# Patient Record
Sex: Male | Born: 1940 | Race: White | Hispanic: No | Marital: Married
Health system: Southern US, Community
[De-identification: ages and names within clinical notes are randomized; demographics above are authoritative.]

---

## 2013-04-14 DIAGNOSIS — J329 Chronic sinusitis, unspecified: Secondary | ICD-10-CM | POA: Insufficient documentation

## 2016-08-13 DIAGNOSIS — C20 Malignant neoplasm of rectum: Secondary | ICD-10-CM | POA: Insufficient documentation

## 2016-11-14 DIAGNOSIS — I639 Cerebral infarction, unspecified: Secondary | ICD-10-CM | POA: Insufficient documentation

## 2016-11-14 DIAGNOSIS — I2699 Other pulmonary embolism without acute cor pulmonale: Secondary | ICD-10-CM | POA: Insufficient documentation

## 2016-11-15 DIAGNOSIS — I6322 Cerebral infarction due to unspecified occlusion or stenosis of basilar arteries: Secondary | ICD-10-CM

## 2016-11-15 DIAGNOSIS — T8131XA Disruption of external operation (surgical) wound, not elsewhere classified, initial encounter: Secondary | ICD-10-CM | POA: Insufficient documentation

## 2016-11-15 DIAGNOSIS — I63211 Cerebral infarction due to unspecified occlusion or stenosis of right vertebral arteries: Secondary | ICD-10-CM | POA: Insufficient documentation

## 2016-11-15 DIAGNOSIS — I6381 Other cerebral infarction due to occlusion or stenosis of small artery: Secondary | ICD-10-CM | POA: Insufficient documentation

## 2016-11-19 ENCOUNTER — Other Ambulatory Visit (HOSPITAL_COMMUNITY): Payer: Self-pay | Admitting: Diagnostic Radiology

## 2016-11-19 DIAGNOSIS — I2602 Saddle embolus of pulmonary artery with acute cor pulmonale: Secondary | ICD-10-CM

## 2016-11-19 DIAGNOSIS — I82401 Acute embolism and thrombosis of unspecified deep veins of right lower extremity: Secondary | ICD-10-CM

## 2016-12-23 ENCOUNTER — Ambulatory Visit
Admission: RE | Admit: 2016-12-23 | Discharge: 2016-12-23 | Disposition: A | Discharge status: Home / Self Care | Payer: Medicare Other | Source: Ambulatory Visit | Attending: Diagnostic Radiology | Admitting: Diagnostic Radiology

## 2016-12-23 DIAGNOSIS — I82401 Acute embolism and thrombosis of unspecified deep veins of right lower extremity: Secondary | ICD-10-CM

## 2016-12-23 DIAGNOSIS — I2602 Saddle embolus of pulmonary artery with acute cor pulmonale: Secondary | ICD-10-CM

## 2016-12-23 NOTE — Progress Notes (Signed)
   Referring Physician(s): Henn,Adam  Chief Complaint: The patient is seen in follow up today s/p IVC filter placement 11/13/2016.  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 Unfortunately, he then developed dehiscence of his abdominal wound requiring an emergent repair 11/12/16.  Around this time patient also developed DVT and PE.  He was initially placed on heparin but developed thrombocytopenia and had continued rectal bleeding.  An IVC filter was requested by Dr. Samuel Germany and placed by Dr. Markus Daft 11/16/16. At discharge, patient was started on Xarelto for management of his PE and DVT.  Patient presents to clinic today for follow-up of his IVC filter.  He has been progressing well at rehab with PT.  He denies shortness of breath, fever, chills, rectal bleeding.  He does continue to have some abdominal pain and wears his abdominal binder as recommended.    No past medical history on file.  No past surgical history on file.  Allergies: Patient has no known allergies.  Medications:  Reviewed   No family history on file.  Social History   Social History  . Marital status: Married    Spouse name: N/A  . Number of children: N/A  . Years of education: N/A   Social History Main Topics  . Smoking status: Not on file  . Smokeless tobacco: Not on file  . Alcohol use Not on file  . Drug use: Unknown  . Sexual activity: Not on file   Other Topics Concern  . Not on file   Social History Narrative  . No narrative on file     Vital Signs: There were no vitals taken for this visit.  Physical Exam  Constitutional: He is oriented to person, place, and time. He appears well-developed.  Cardiovascular: Normal rate, regular rhythm and normal heart sounds.   Pulmonary/Chest: Effort normal and breath sounds normal. No respiratory distress.  Abdominal:  Abdominal binder in  place.   Musculoskeletal: He exhibits no edema.  Neurological: He is alert and oriented to person, place, and time.  Skin: Skin is warm and dry.  Nursing note reviewed.   Imaging: No results found.  Labs:  Reviewed, none new since discharge  Assessment: Patient with history of adenocarcinoma of the rectum s/p resection, DVT and PE s/p IVC filter placement 11/16/16 by Dr. Anselm Pancoast, now on anti-coagulation without rectal bleeding.  US performed with no DVT seen today.  See separate dictated report.  Patient doing well since surgery and has been increasing his activity and ambulation with PT at rehab. Per Oncology note 12/07/16, patient to remain on anticoagulation with filter in place for approximately 6 months.  Discussed timeline and implications of removal of IVC filter with patient.  Patient agreeable to having filter removed at 6 months.  Will schedule patient for procedure accordingly.   Signed: Docia Barrier 12/23/2016, 3:25 PM   Please refer to Dr. Anselm Pancoast attestation of this note for management and plan.

## 2017-01-18 DIAGNOSIS — M109 Gout, unspecified: Secondary | ICD-10-CM | POA: Insufficient documentation

## 2017-01-24 ENCOUNTER — Other Ambulatory Visit (HOSPITAL_COMMUNITY): Payer: Self-pay | Admitting: Diagnostic Radiology

## 2017-01-24 DIAGNOSIS — Z86718 Personal history of other venous thrombosis and embolism: Secondary | ICD-10-CM

## 2017-03-07 DIAGNOSIS — D3502 Benign neoplasm of left adrenal gland: Secondary | ICD-10-CM | POA: Insufficient documentation

## 2017-03-09 ENCOUNTER — Ambulatory Visit
Admission: RE | Admit: 2017-03-09 | Discharge: 2017-03-09 | Disposition: A | Discharge status: Home / Self Care | Payer: Medicare Other | Source: Ambulatory Visit | Attending: Diagnostic Radiology | Admitting: Diagnostic Radiology

## 2017-03-09 DIAGNOSIS — Z86718 Personal history of other venous thrombosis and embolism: Secondary | ICD-10-CM

## 2017-03-09 HISTORY — PX: IR RADIOLOGIST EVAL & MGMT: IMG5224

## 2017-03-09 NOTE — Progress Notes (Signed)
Referring Physician(s): Palmer,M  Chief Complaint: The patient is seen in follow up today s/p IVC filter placement on 11/13/16  History of present illness: Douglas Wilson is a 76 year old male with history of adenocarcinoma of the rectum and rectal bleeding who underwent surgical resection of his cancer on 10/22/16. Unfortunately, he then developed dehiscence of his abdominal wound requiring an emergent repair 11/12/16.  Around this time patient also developed LLE DVT and PE.  He was initially placed on heparin but developed thrombocytopenia and had continued rectal bleeding.  An IVC filter was requested by Dr. Samuel Germany and placed by Dr. Markus Daft 11/16/16 at [facility]. At discharge, patient was started on Xarelto for management of his PE and DVT. Patient presents to clinic again today for follow-up of his IVC filter and bilateral lower extremity venous Doppler study.His most recent lower extremity venous study on 12/23/16 was negative for DVT. According to the patient he has done fairly well. He continues to takes xarelto. Over the past weekend, however he was seen at [facility] for abrupt hematuria secondary to left renal stone. Gross hematuria has resolved, however he does continue to have some blood tinged urine. He denies any rectal bleeding or hemoptysis. 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 He denies fever, headache, chest pain, dyspnea, cough, significant abdominal pain, nausea or vomiting.   No past medical history on file.  No past surgical history on file.  Allergies: Patient has no known allergies.  Medications: Prior to Admission medications   Not on File     No family history on file.  Social History   Social History  . Marital status: Married    Spouse name: N/A  . Number of children: N/A  . Years of education: N/A    Social History Main Topics  . Smoking status: Not on file  . Smokeless tobacco: Not on file  . Alcohol use Not on file  . Drug use: Unknown  . Sexual activity: Not on file   Other Topics Concern  . Not on file   Social History Narrative  . No narrative on file     Vital Signs: There were no vitals taken for this visit.  Physical Exam patient awake, alert. Chest clear to auscultation bilaterally. Heart with regular rate and rhythm. Abdomen soft, clean intact midline surgical scar, positive bowel sounds, nontender. Trace left lower extremity pretibial edema, no right lower extremity edema. Diminished grip strength left upper extremity.  Imaging: No results found.  Labs:  CBC: No results for input(s): WBC, HGB, HCT, PLT in the last 8760 hours.  COAGS: No results for input(s): INR, APTT in the last 8760 hours.  BMP: No results for input(s): NA, K, CL, CO2, GLUCOSE, BUN, CALCIUM, CREATININE, GFRNONAA, GFRAA in the last 8760 hours.  Invalid input(s): CMP  LIVER FUNCTION TESTS: No results for input(s): BILITOT, AST, ALT, ALKPHOS, PROT, ALBUMIN in the last 8760 hours.  Assessment: Patient with history of adenocarcinoma of the rectum s/p resection, LLE DVT and PE ,s/p IVC filter placement 11/16/16 by Dr. Anselm Pancoast, now on xarelto without rectal bleeding. Patient did have episode of gross hematuria this past weekend with left renal stone detected. Urine now blood tinged. He denies rectal bleeding or hemoptysis. Preliminary findings from bilateral lower extremity venous Doppler study today revealed no evidence of acute /residual DVT. In view  of recent hematuria and prior DVT/PE patient was advised to maintain IVC filter at this time until bleeding has completely resolved. He'll be scheduled for follow-up evaluation in the IR clinic in 6 months to reassess need for IVC filter removal.   Signed: D. Rowe Robert 03/09/2017, 3:56 PM   Please refer to Dr. Moises Blood attestation of this note  for management and plan.      Patient ID: Douglas Wilson, male   DOB: 11-05-1941, 76 y.o.   MRN: [id number]

## 2017-04-22 ENCOUNTER — Encounter: Payer: Self-pay | Admitting: Diagnostic Radiology

## 2017-08-25 ENCOUNTER — Other Ambulatory Visit (HOSPITAL_COMMUNITY): Payer: Self-pay | Admitting: Diagnostic Radiology

## 2017-08-25 DIAGNOSIS — Z86718 Personal history of other venous thrombosis and embolism: Secondary | ICD-10-CM

## 2017-08-31 ENCOUNTER — Ambulatory Visit
Admission: RE | Admit: 2017-08-31 | Discharge: 2017-08-31 | Disposition: A | Discharge status: Home / Self Care | Payer: Medicare Other | Source: Ambulatory Visit | Attending: Diagnostic Radiology | Admitting: Diagnostic Radiology

## 2017-08-31 DIAGNOSIS — R0602 Shortness of breath: Secondary | ICD-10-CM | POA: Insufficient documentation

## 2017-08-31 DIAGNOSIS — R011 Cardiac murmur, unspecified: Secondary | ICD-10-CM | POA: Insufficient documentation

## 2017-08-31 DIAGNOSIS — R12 Heartburn: Secondary | ICD-10-CM | POA: Insufficient documentation

## 2017-08-31 DIAGNOSIS — R1310 Dysphagia, unspecified: Secondary | ICD-10-CM | POA: Insufficient documentation

## 2017-08-31 DIAGNOSIS — Z86718 Personal history of other venous thrombosis and embolism: Secondary | ICD-10-CM

## 2017-08-31 DIAGNOSIS — R0681 Apnea, not elsewhere classified: Secondary | ICD-10-CM | POA: Insufficient documentation

## 2017-08-31 DIAGNOSIS — I10 Essential (primary) hypertension: Secondary | ICD-10-CM | POA: Insufficient documentation

## 2017-08-31 HISTORY — PX: IR RADIOLOGIST EVAL & MGMT: IMG5224

## 2017-08-31 NOTE — Progress Notes (Signed)
Referring Physician(s): Dr Deliah Goody, A  Chief Complaint: The patient is seen in follow up today s/p Inferior vena cava filter placement 11/16/16. Hx LLE DVT and PE Post 10/2016 surgical resection for adenocarcinoma rectum- with consequent abdominal wound requiring repair. Developed thrombocytopenia and rectal bleeding with anticoagulation for DVT/PE treatment. Started on Xarelto at discharge  History of present illness:  Dr Anselm Pancoast note 5/2/108: 76 year old with history of PE and left lower extremity DVT.  Patient has no lower extremity DVT right now but he continues to have bleeding, now hematuria.  He is a candidate for IVC filter removal but I am concerned that he may need to come off the Xarelto in the future due to his bleeding problems.  Patient is in no hurry to have another procedure and filter removal.  We discussed declaring the filter "permanent" due to his co-morbidities and decided that we will reassess in 6 months.    Pt returns today for discussion around consideration of IVC filter removal. States he "took medicine for kidney stone" and symptoms have resolved Denies pain or hematuria for months. Continues Xarelto daily Plan is for continuation for now per MD. Pt sees Dr Deliah Goody in [place], [place]. She follows him for same.  Pt has hd 2 doppler studies since filter placement; 12/2016 and 03/2017 IMPRESSION: No evidence of DVT with either lower extremity.  He is also complaining of Rt shoulder pain - sudden onset last Sat night-- denies injury. Apparently Dr Deliah Goody has ordered imaging for pt tomorrow concerning shoulder pain and "breast tissue enlargement" as per pt.   No past medical history on file.  Past Surgical History:  Procedure Laterality Date  . IR RADIOLOGIST EVAL & MGMT  03/09/2017    Allergies: Patient has no known allergies.  Medications: Prior to Admission medications   Medication Sig Start Date End Date Taking? Authorizing Provider  allopurinol  (ZYLOPRIM) 100 MG tablet TAKE 1 TABLET(100 MG) BY MOUTH DAILY 07/18/17  Yes [provider]  atorvastatin (LIPITOR) 80 MG tablet Take 80 mg by mouth. 11/19/16  Yes [provider]  magnesium oxide (MAG-OX) 400 MG tablet Take 400 mg by mouth. 11/20/16 11/20/17 Yes [provider]  rivaroxaban (XARELTO) 20 MG TABS tablet Take 20 mg by mouth. 12/17/16  Yes [provider]     No family history on file.  Social History   Social History  . Marital status: Married    Spouse name: N/A  . Number of children: N/A  . Years of education: N/A   Social History Main Topics  . Smoking status: Not on file  . Smokeless tobacco: Not on file  . Alcohol use Not on file  . Drug use: Unknown  . Sexual activity: Not on file   Other Topics Concern  . Not on file   Social History Narrative  . No narrative on file     Vital Signs: BP (!) 156/89 (BP Location: Left Arm, Patient Position: Sitting, Cuff Size: Normal)   Pulse 69   Temp 97.6 F (36.4 C)   Resp 18   Ht 5\' 9"  (1.753 m)   Wt 122 lb (55.3 kg)   SpO2 98%   BMI 18.02 kg/m   Physical Exam  Constitutional: He is oriented to person, place, and time.  Musculoskeletal: Normal range of motion.  Neurological: He is alert and oriented to person, place, and time.  Skin: Skin is warm and dry.  Psychiatric: He has a normal mood and affect. His  behavior is normal.  Nursing note and vitals reviewed.   Imaging: No results found.  Labs:  CBC: No results for input(s): WBC, HGB, HCT, PLT in the last 8760 hours.  COAGS: No results for input(s): INR, APTT in the last 8760 hours.  BMP: No results for input(s): NA, K, CL, CO2, GLUCOSE, BUN, CALCIUM, CREATININE, GFRNONAA, GFRAA in the last 8760 hours.  Invalid input(s): CMP  LIVER FUNCTION TESTS: No results for input(s): BILITOT, AST, ALT, ALKPHOS, PROT, ALBUMIN in the last 8760 hours.  Assessment:  Hx LLE DVT and PE 11/2016 Inferior vena cava filter placed  11/16/16 Continues on Xarelto for now B Lower extremity doppler negative for DVT 12/2016 and 03/2017 Renal stones have resolved---without hematuria or any bleeding issues. Can now be scheduled for IVC retrieval at his convenience Wishes to have procedure scheduled at [facility].  Signed: [place], PA-C 08/31/2017, 2:43 PM   Please refer to Dr. Anselm Pancoast attestation of this note for management and plan.

## 2017-09-07 ENCOUNTER — Encounter: Payer: Self-pay | Admitting: Diagnostic Radiology

## 2018-10-02 IMAGING — US US EXTREM LOW VENOUS BILAT
1 series · 12 of 24 positions shown · non-contrast
Comparison: Venous duplex report from 11/14/2016 at [REDACTED].

CLINICAL DATA: 76-year-old with history of left lower extremity DVT
in the left common femoral vein and a posterior tibial vein. Patient
had an IVC filter placement. Follow DVT.



[Series 1: us extrem low venous bilat · 0.06mm/px · 12 of 73 slices shown]
[im 4/73]
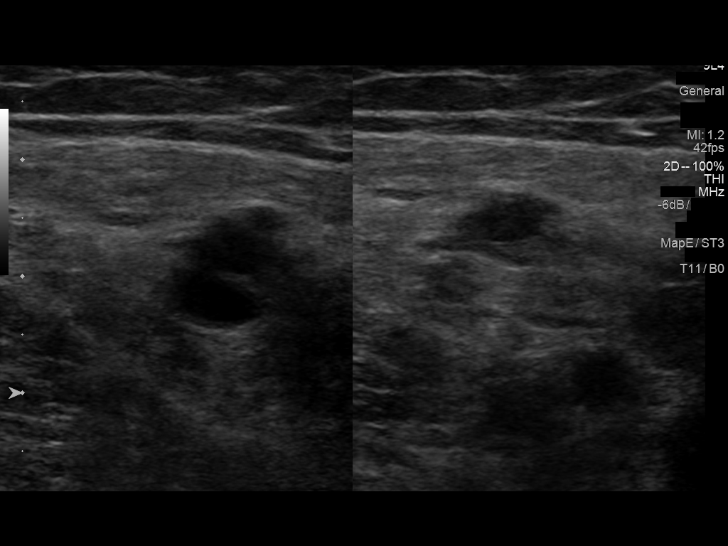
[im 10/73]
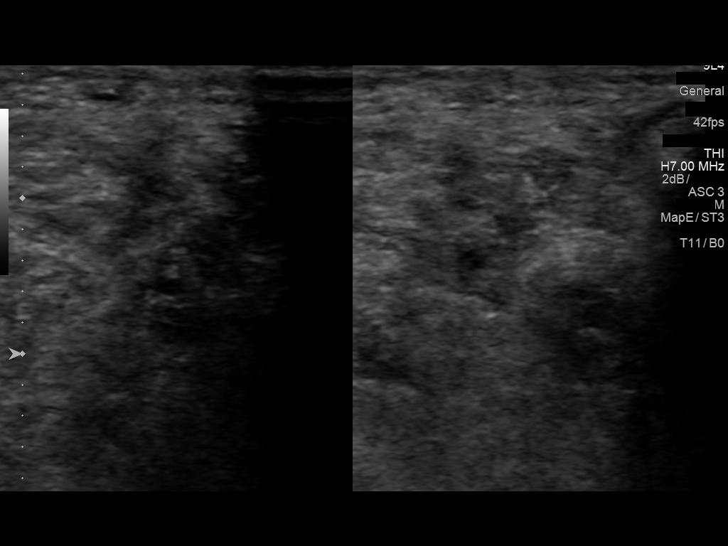
[im 16/73]
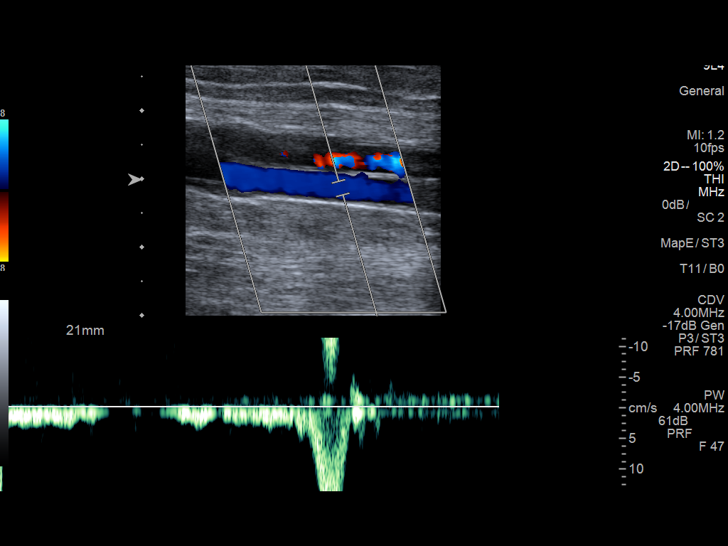
[im 22/73]
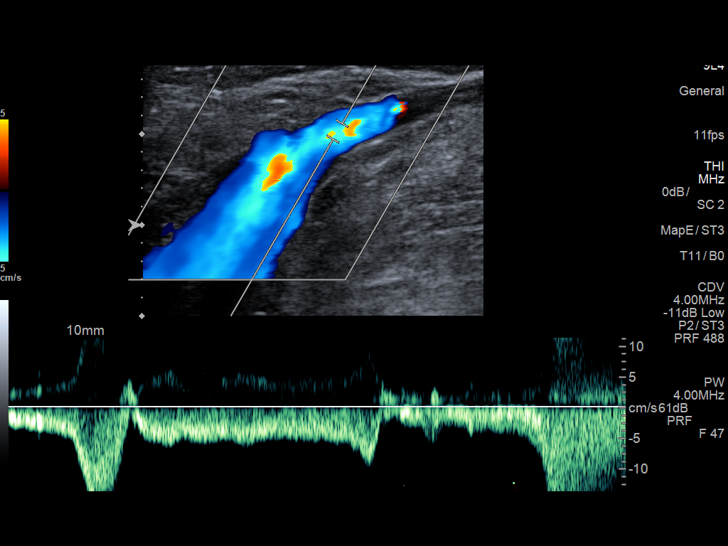
[im 29/73]
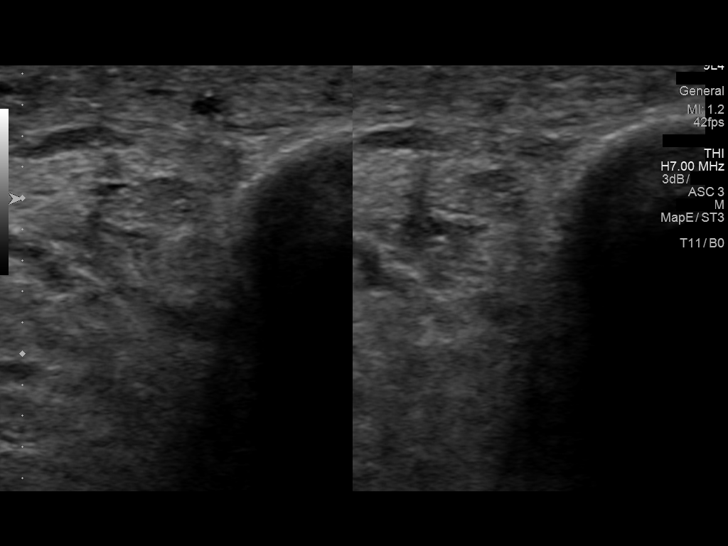
[im 35/73]
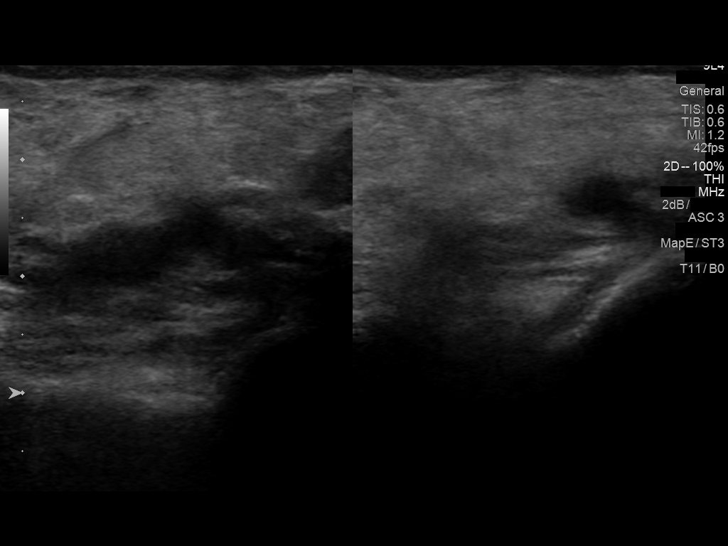
[im 41/73]
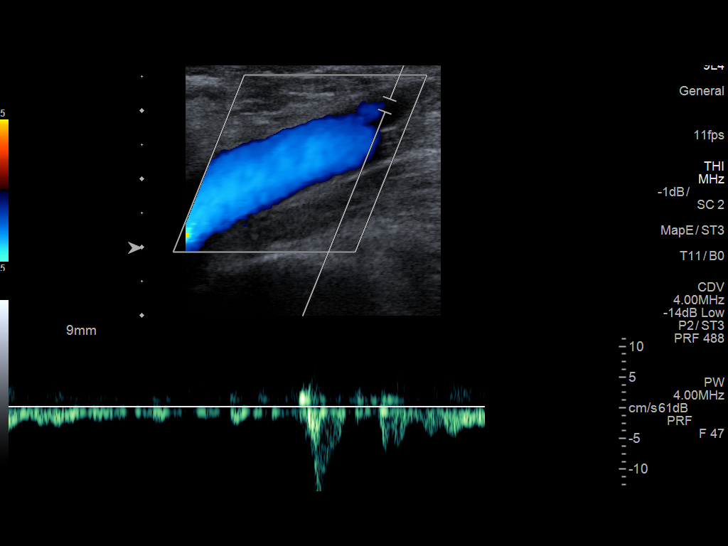
[im 47/73]
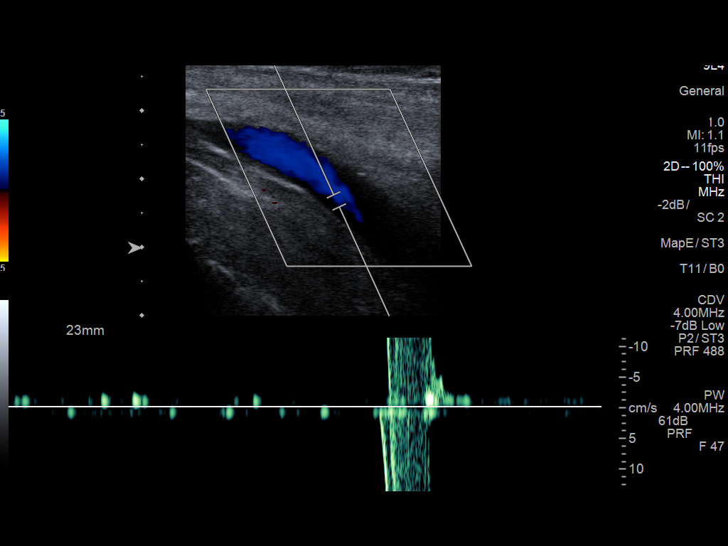
[im 54/73]
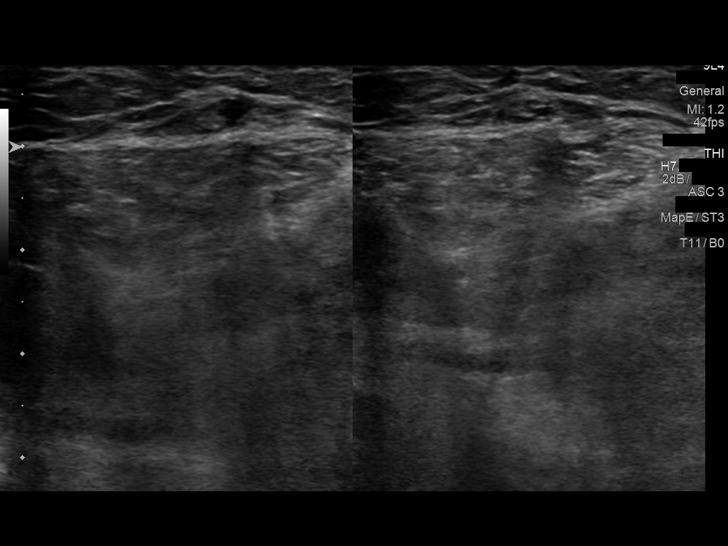
[im 60/73]
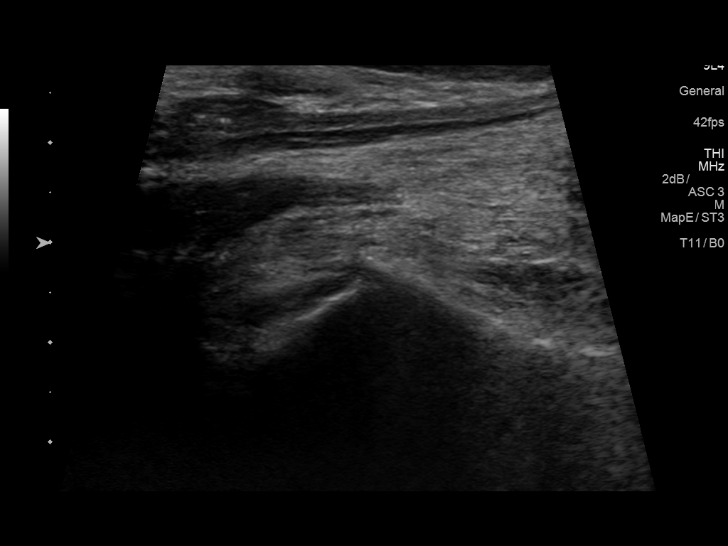
[im 66/73]
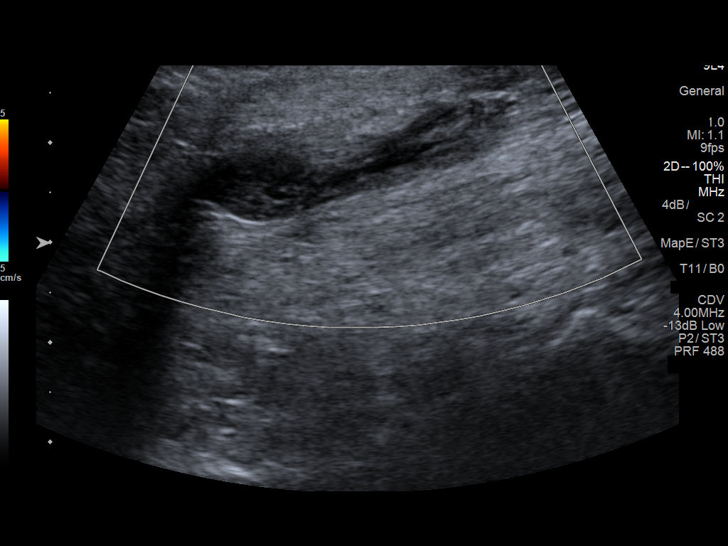
[im 73/73]
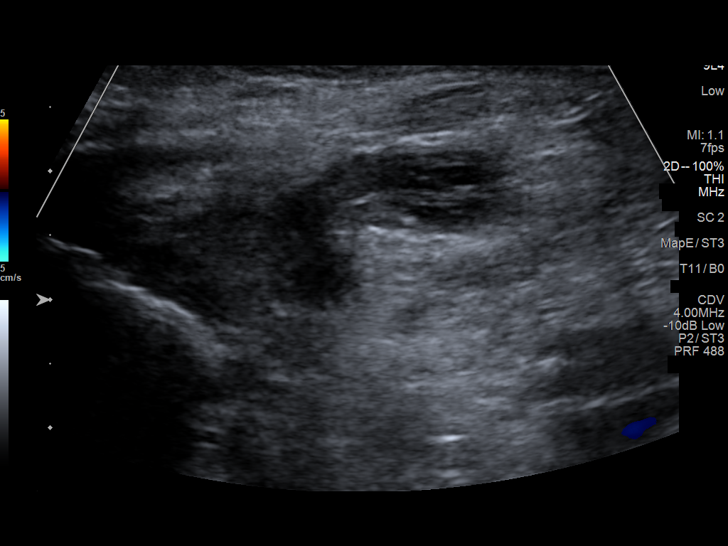

[12 of 24 positions shown; findings below may reference images not displayed]

FINDINGS: RIGHT LOWER EXTREMITY

Common Femoral Vein: No evidence of thrombus. Normal
compressibility, respiratory phasicity and response to augmentation.

Saphenofemoral Junction: No evidence of thrombus. Normal
compressibility and flow on color Doppler imaging.

Profunda Femoral Vein: No evidence of thrombus. Normal
compressibility and flow on color Doppler imaging.

Femoral Vein: No evidence of thrombus. Normal compressibility,
respiratory phasicity and response to augmentation.

Popliteal Vein: No evidence of thrombus. Normal compressibility,
respiratory phasicity and response to augmentation.

Calf Veins: No evidence of thrombus. Normal compressibility and flow
on color Doppler imaging.

Superficial Great Saphenous Vein: No evidence of thrombus. Normal
compressibility.

Other Findings: There is a small irregular hypoechoic area in the
posteromedial right knee. This probably represents a small Baker's
cyst measuring 4.1 x 0.9 x 1.3 cm.

LEFT LOWER EXTREMITY

Common Femoral Vein: No evidence of thrombus. Normal
compressibility, respiratory phasicity and response to augmentation.

Saphenofemoral Junction: No evidence of thrombus. Normal
compressibility and flow on color Doppler imaging.

Profunda Femoral Vein: No evidence of thrombus. Normal
compressibility and flow on color Doppler imaging.

Femoral Vein: No evidence of thrombus. Normal compressibility,
respiratory phasicity and response to augmentation.

Popliteal Vein: No evidence of thrombus. Normal compressibility,
respiratory phasicity and response to augmentation.

Calf Veins: No evidence of thrombus. Normal compressibility and flow
on color Doppler imaging.

Superficial Great Saphenous Vein: No evidence of thrombus. Normal
compressibility.

Other Findings: Small complex fluid collection in the left
posteromedial knee region. Findings compatible with a Baker's cyst
measuring 3.9 x 1.0 x 3.1 cm.
IMPRESSION: No evidence of deep venous thrombosis in the lower extremities.

Evidence for small bilateral Baker's cysts.

## 2018-12-17 IMAGING — US US EXTREM LOW VENOUS BILAT
1 series · 13 of 24 positions shown · non-contrast
Comparison: 12/23/2016

CLINICAL DATA: History of left lower extremity DVT. Patient is on
anticoagulation therapy. Patient has an IVC filter.



[Series 1: us extrem low venous bilat · 0.07mm/px · 13 of 66 slices shown]
[im 1/66]
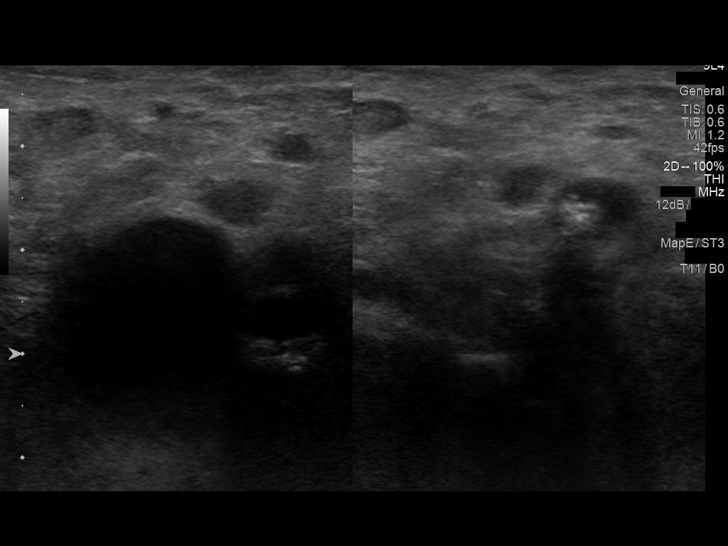
[im 6/66]
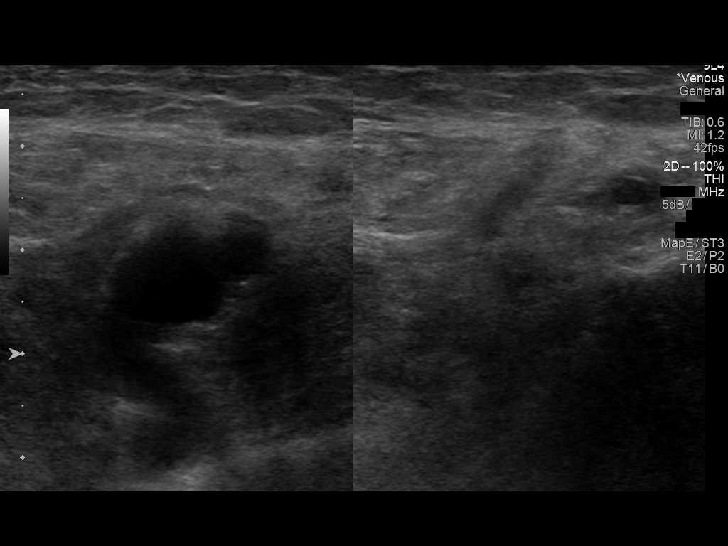
[im 12/66]
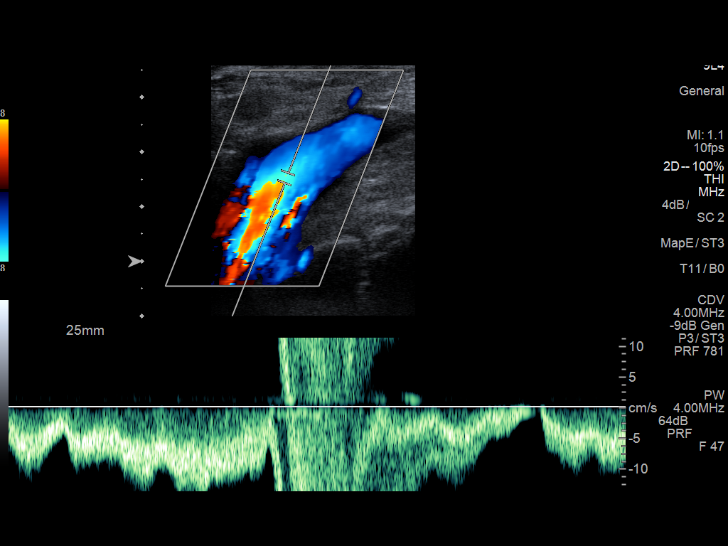
[im 17/66]
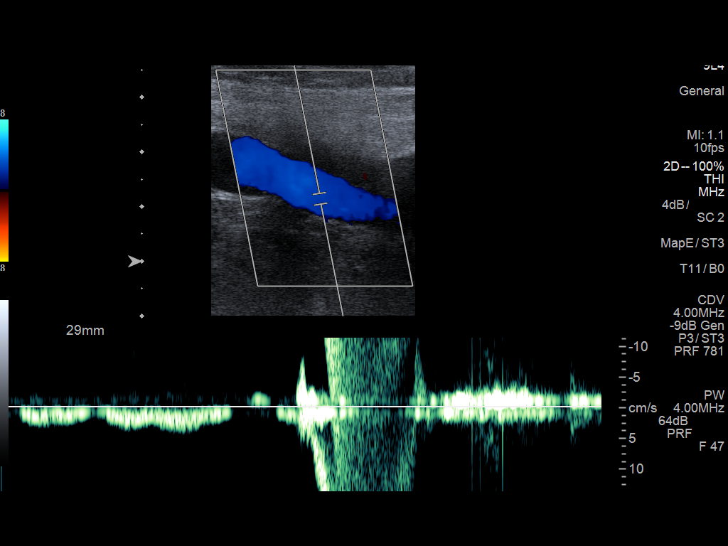
[im 23/66]
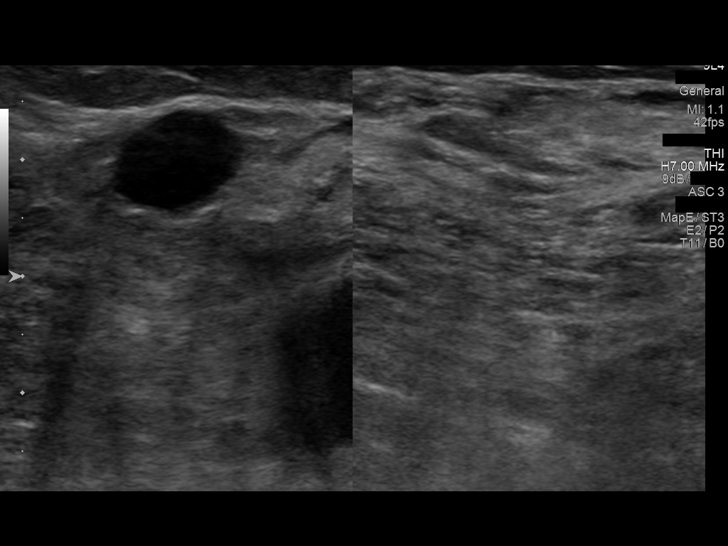
[im 29/66]
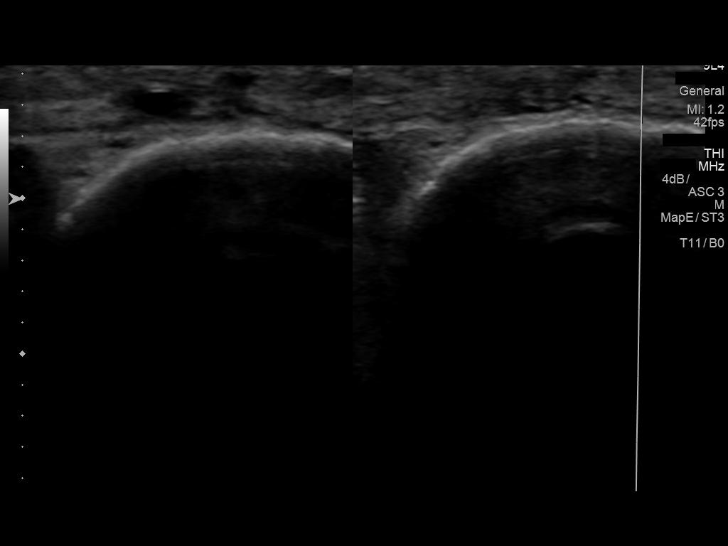
[im 34/66]
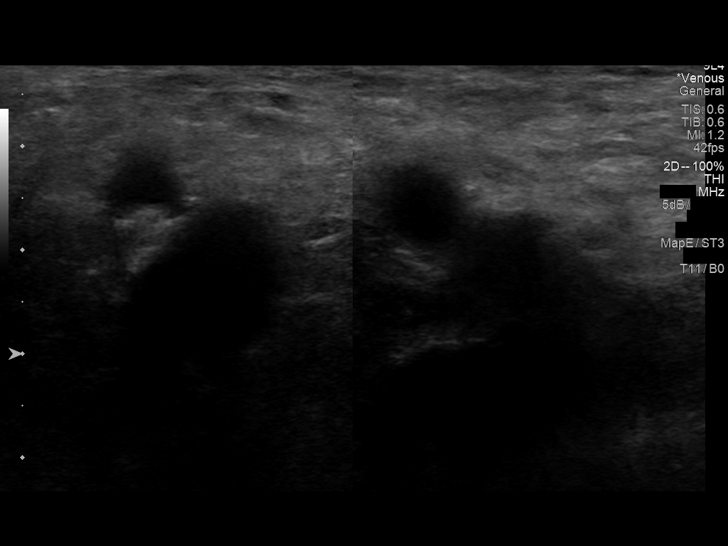
[im 37/66]
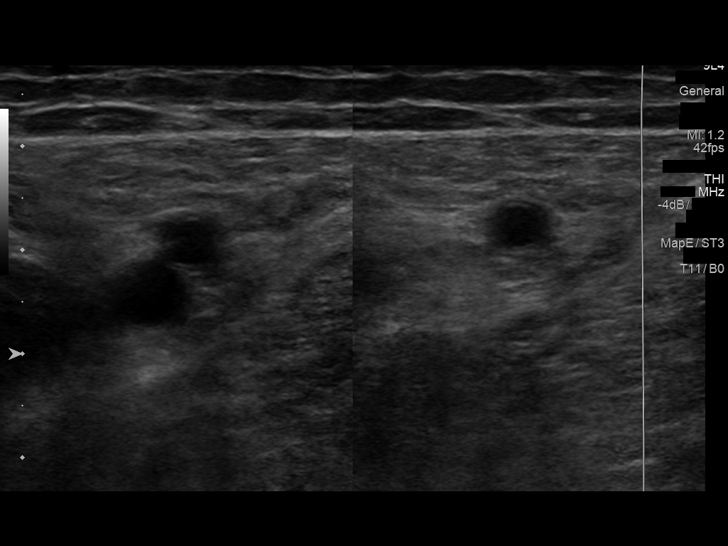
[im 43/66]
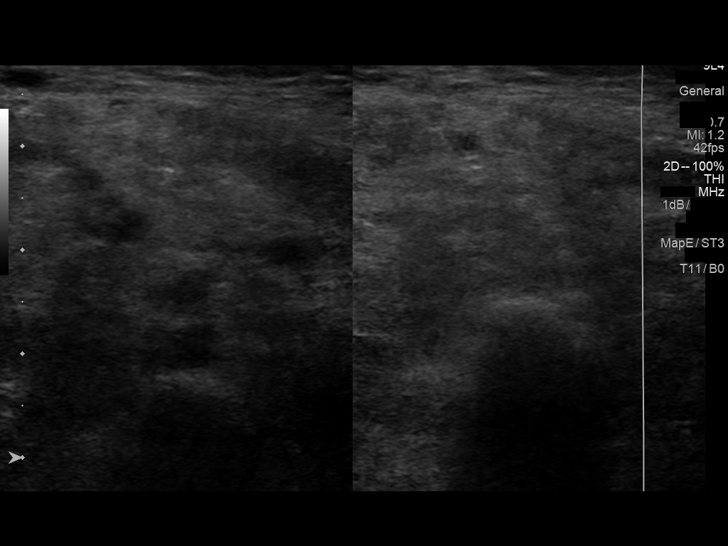
[im 49/66]
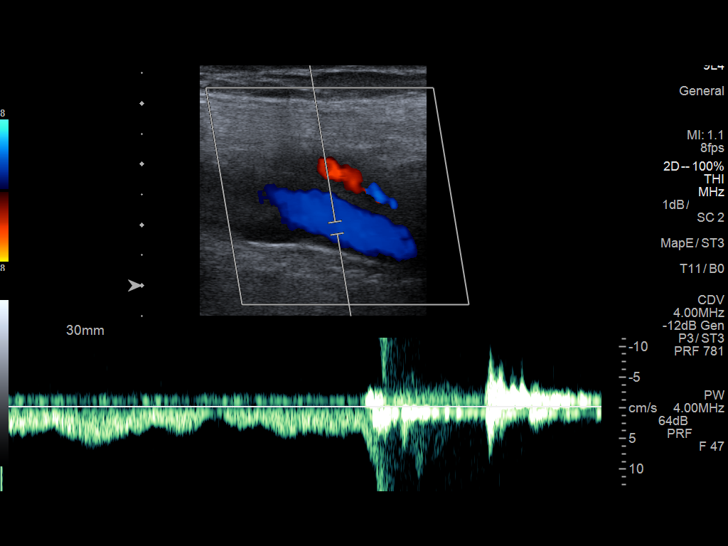
[im 54/66]
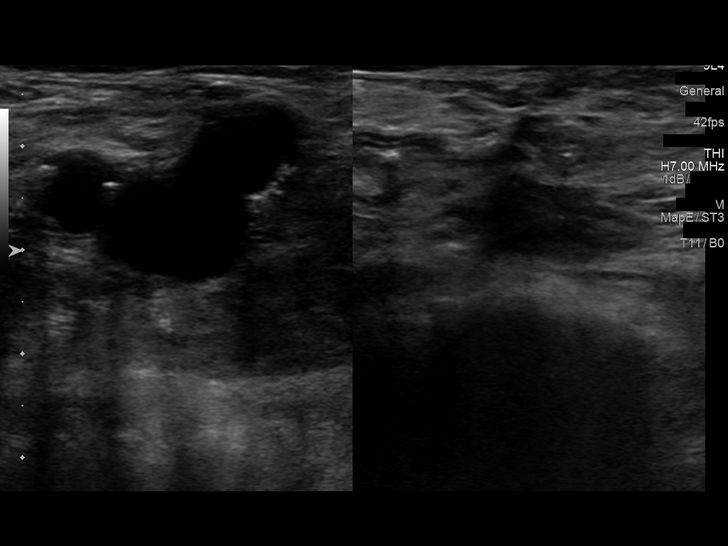
[im 60/66]
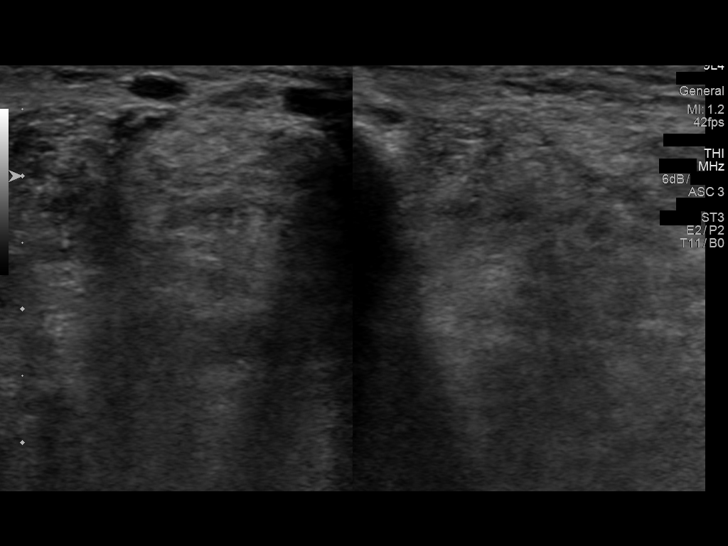
[im 66/66]
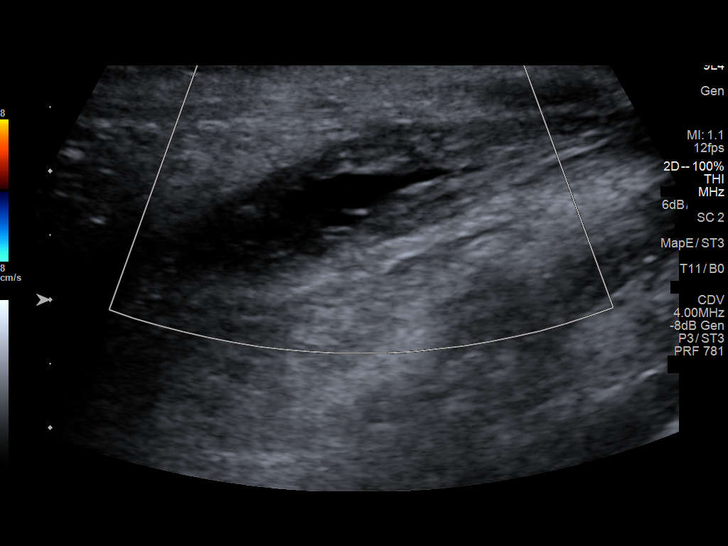

[13 of 24 positions shown; findings below may reference images not displayed]

FINDINGS: RIGHT LOWER EXTREMITY

Common Femoral Vein: No evidence of thrombus. Normal
compressibility, respiratory phasicity and response to augmentation.

Saphenofemoral Junction: No evidence of thrombus. Normal
compressibility and flow on color Doppler imaging.

Profunda Femoral Vein: No evidence of thrombus. Normal
compressibility and flow on color Doppler imaging.

Femoral Vein: No evidence of thrombus. Normal compressibility,
respiratory phasicity and response to augmentation.

Popliteal Vein: No evidence of thrombus. Normal compressibility,
respiratory phasicity and response to augmentation.

Calf Veins: No evidence of thrombus. Normal compressibility and flow
on color Doppler imaging.

Superficial Great Saphenous Vein: No evidence of thrombus. Normal
compressibility.

Other Findings: Again noted is a small cystic structure in the
medial right knee compatible with a small Baker's cyst.

LEFT LOWER EXTREMITY

Common Femoral Vein: No evidence of thrombus. Normal
compressibility, respiratory phasicity and response to augmentation.

Saphenofemoral Junction: No evidence of thrombus. Normal
compressibility and flow on color Doppler imaging.

Profunda Femoral Vein: No evidence of thrombus. Normal
compressibility and flow on color Doppler imaging.

Femoral Vein: No evidence of thrombus. Normal compressibility,
respiratory phasicity and response to augmentation.

Popliteal Vein: No evidence of thrombus. Normal compressibility,
respiratory phasicity and response to augmentation.

Calf Veins: No evidence of thrombus. Normal compressibility and flow
on color Doppler imaging.

Superficial Great Saphenous Vein: No evidence of thrombus. Normal
compressibility.

Other Findings: Small cystic structure in the medial left knee is
compatible with a Baker's cyst.
IMPRESSION: No evidence of DVT with either lower extremity.

Small bilateral Is cysts.

## 2023-05-11 DIAGNOSIS — I700 Atherosclerosis of aorta: Secondary | ICD-10-CM | POA: Diagnosis not present

## 2023-05-11 DIAGNOSIS — R079 Chest pain, unspecified: Secondary | ICD-10-CM | POA: Diagnosis not present

## 2023-05-11 DIAGNOSIS — R0602 Shortness of breath: Secondary | ICD-10-CM | POA: Diagnosis not present

## 2023-05-11 DIAGNOSIS — Z13220 Encounter for screening for lipoid disorders: Secondary | ICD-10-CM | POA: Diagnosis not present

## 2023-05-11 DIAGNOSIS — R053 Chronic cough: Secondary | ICD-10-CM | POA: Diagnosis not present

## 2023-05-11 DIAGNOSIS — K5730 Diverticulosis of large intestine without perforation or abscess without bleeding: Secondary | ICD-10-CM | POA: Diagnosis not present

## 2023-05-11 DIAGNOSIS — J439 Emphysema, unspecified: Secondary | ICD-10-CM | POA: Diagnosis not present

## 2023-05-11 DIAGNOSIS — I2510 Atherosclerotic heart disease of native coronary artery without angina pectoris: Secondary | ICD-10-CM | POA: Diagnosis not present

## 2023-05-17 DIAGNOSIS — H35363 Drusen (degenerative) of macula, bilateral: Secondary | ICD-10-CM | POA: Diagnosis not present

## 2023-05-17 DIAGNOSIS — H35033 Hypertensive retinopathy, bilateral: Secondary | ICD-10-CM | POA: Diagnosis not present

## 2023-05-17 DIAGNOSIS — H25812 Combined forms of age-related cataract, left eye: Secondary | ICD-10-CM | POA: Diagnosis not present

## 2023-05-17 DIAGNOSIS — H524 Presbyopia: Secondary | ICD-10-CM | POA: Diagnosis not present

## 2023-05-17 DIAGNOSIS — H52203 Unspecified astigmatism, bilateral: Secondary | ICD-10-CM | POA: Diagnosis not present

## 2023-05-17 DIAGNOSIS — H25813 Combined forms of age-related cataract, bilateral: Secondary | ICD-10-CM | POA: Diagnosis not present

## 2023-05-17 DIAGNOSIS — H353133 Nonexudative age-related macular degeneration, bilateral, advanced atrophic without subfoveal involvement: Secondary | ICD-10-CM | POA: Diagnosis not present

## 2023-06-08 DIAGNOSIS — E039 Hypothyroidism, unspecified: Secondary | ICD-10-CM | POA: Diagnosis not present

## 2023-06-08 DIAGNOSIS — Z13220 Encounter for screening for lipoid disorders: Secondary | ICD-10-CM | POA: Diagnosis not present

## 2023-06-08 DIAGNOSIS — Z0000 Encounter for general adult medical examination without abnormal findings: Secondary | ICD-10-CM | POA: Diagnosis not present

## 2023-07-06 DIAGNOSIS — R053 Chronic cough: Secondary | ICD-10-CM | POA: Diagnosis not present

## 2023-07-06 DIAGNOSIS — I5189 Other ill-defined heart diseases: Secondary | ICD-10-CM | POA: Diagnosis not present

## 2023-07-06 DIAGNOSIS — J432 Centrilobular emphysema: Secondary | ICD-10-CM | POA: Diagnosis not present

## 2023-07-06 DIAGNOSIS — J449 Chronic obstructive pulmonary disease, unspecified: Secondary | ICD-10-CM | POA: Diagnosis not present

## 2023-07-06 DIAGNOSIS — R0602 Shortness of breath: Secondary | ICD-10-CM | POA: Diagnosis not present

## 2023-09-07 DIAGNOSIS — R079 Chest pain, unspecified: Secondary | ICD-10-CM | POA: Diagnosis not present

## 2023-09-07 DIAGNOSIS — Z7901 Long term (current) use of anticoagulants: Secondary | ICD-10-CM | POA: Diagnosis not present

## 2023-09-07 DIAGNOSIS — J449 Chronic obstructive pulmonary disease, unspecified: Secondary | ICD-10-CM | POA: Diagnosis not present

## 2023-09-07 DIAGNOSIS — I358 Other nonrheumatic aortic valve disorders: Secondary | ICD-10-CM | POA: Diagnosis not present

## 2023-09-07 DIAGNOSIS — R0602 Shortness of breath: Secondary | ICD-10-CM | POA: Diagnosis not present

## 2023-09-07 DIAGNOSIS — Z86711 Personal history of pulmonary embolism: Secondary | ICD-10-CM | POA: Diagnosis not present

## 2023-09-07 DIAGNOSIS — I10 Essential (primary) hypertension: Secondary | ICD-10-CM | POA: Diagnosis not present

## 2023-09-15 DIAGNOSIS — S40011A Contusion of right shoulder, initial encounter: Secondary | ICD-10-CM | POA: Diagnosis not present

## 2023-09-15 DIAGNOSIS — M25521 Pain in right elbow: Secondary | ICD-10-CM | POA: Diagnosis not present

## 2023-09-15 DIAGNOSIS — S59911A Unspecified injury of right forearm, initial encounter: Secondary | ICD-10-CM | POA: Diagnosis not present

## 2023-09-15 DIAGNOSIS — M19011 Primary osteoarthritis, right shoulder: Secondary | ICD-10-CM | POA: Diagnosis not present

## 2023-09-15 DIAGNOSIS — S59901A Unspecified injury of right elbow, initial encounter: Secondary | ICD-10-CM | POA: Diagnosis not present

## 2023-09-15 DIAGNOSIS — S4991XA Unspecified injury of right shoulder and upper arm, initial encounter: Secondary | ICD-10-CM | POA: Diagnosis not present

## 2023-09-15 DIAGNOSIS — I700 Atherosclerosis of aorta: Secondary | ICD-10-CM | POA: Diagnosis not present

## 2023-09-27 DIAGNOSIS — J449 Chronic obstructive pulmonary disease, unspecified: Secondary | ICD-10-CM | POA: Diagnosis not present

## 2023-09-27 DIAGNOSIS — J432 Centrilobular emphysema: Secondary | ICD-10-CM | POA: Diagnosis not present

## 2023-10-04 DIAGNOSIS — H25812 Combined forms of age-related cataract, left eye: Secondary | ICD-10-CM | POA: Diagnosis not present

## 2023-10-04 DIAGNOSIS — H2512 Age-related nuclear cataract, left eye: Secondary | ICD-10-CM | POA: Diagnosis not present

## 2023-10-12 DIAGNOSIS — J432 Centrilobular emphysema: Secondary | ICD-10-CM | POA: Diagnosis not present

## 2023-10-12 DIAGNOSIS — R053 Chronic cough: Secondary | ICD-10-CM | POA: Diagnosis not present

## 2023-10-12 DIAGNOSIS — J449 Chronic obstructive pulmonary disease, unspecified: Secondary | ICD-10-CM | POA: Diagnosis not present

## 2023-10-12 DIAGNOSIS — I5189 Other ill-defined heart diseases: Secondary | ICD-10-CM | POA: Diagnosis not present

## 2023-10-24 DIAGNOSIS — H25811 Combined forms of age-related cataract, right eye: Secondary | ICD-10-CM | POA: Diagnosis not present

## 2023-11-17 DIAGNOSIS — H25811 Combined forms of age-related cataract, right eye: Secondary | ICD-10-CM | POA: Diagnosis not present

## 2023-11-17 DIAGNOSIS — H2511 Age-related nuclear cataract, right eye: Secondary | ICD-10-CM | POA: Diagnosis not present
